# Patient Record
Sex: Female | Born: 1980 | Race: Black or African American | Hispanic: No | Marital: Single | Smoking: Former smoker
Health system: Southern US, Community
[De-identification: ages and names within clinical notes are randomized; demographics above are authoritative.]

## PROBLEM LIST (undated history)

## (undated) HISTORY — PX: TUBAL LIGATION: SHX77

---

## 2020-03-23 ENCOUNTER — Emergency Department: Payer: BC Managed Care – PPO

## 2020-03-23 ENCOUNTER — Other Ambulatory Visit: Payer: Self-pay

## 2020-03-23 ENCOUNTER — Emergency Department
Admission: EM | Admit: 2020-03-23 | Discharge: 2020-03-24 | Disposition: A | Discharge status: Home / Self Care | Payer: BC Managed Care – PPO | Attending: Emergency Medicine | Admitting: Emergency Medicine

## 2020-03-23 DIAGNOSIS — S0990XA Unspecified injury of head, initial encounter: Secondary | ICD-10-CM | POA: Insufficient documentation

## 2020-03-23 DIAGNOSIS — S61411A Laceration without foreign body of right hand, initial encounter: Secondary | ICD-10-CM | POA: Diagnosis not present

## 2020-03-23 DIAGNOSIS — R0789 Other chest pain: Secondary | ICD-10-CM | POA: Insufficient documentation

## 2020-03-23 DIAGNOSIS — F17210 Nicotine dependence, cigarettes, uncomplicated: Secondary | ICD-10-CM | POA: Insufficient documentation

## 2020-03-23 DIAGNOSIS — S52602A Unspecified fracture of lower end of left ulna, initial encounter for closed fracture: Secondary | ICD-10-CM | POA: Diagnosis not present

## 2020-03-23 DIAGNOSIS — Q7190 Unspecified reduction defect of unspecified upper limb: Secondary | ICD-10-CM

## 2020-03-23 DIAGNOSIS — S52502A Unspecified fracture of the lower end of left radius, initial encounter for closed fracture: Secondary | ICD-10-CM

## 2020-03-23 DIAGNOSIS — S6992XA Unspecified injury of left wrist, hand and finger(s), initial encounter: Secondary | ICD-10-CM | POA: Diagnosis present

## 2020-03-23 LAB — COMPREHENSIVE METABOLIC PANEL
ALT: 13 U/L (ref 0–44)
AST: 23 U/L (ref 15–41)
Albumin: 4 g/dL (ref 3.5–5.0)
Alkaline Phosphatase: 71 U/L (ref 38–126)
Anion gap: 10 (ref 5–15)
BUN: 9 mg/dL (ref 6–20)
CO2: 22 mmol/L (ref 22–32)
Calcium: 9 mg/dL (ref 8.9–10.3)
Chloride: 106 mmol/L (ref 98–111)
Creatinine, Ser: 0.66 mg/dL (ref 0.44–1.00)
GFR, Estimated: >60 mL/min (ref >60)
Glucose, Bld: 121 mg/dL — ABNORMAL HIGH (ref 70–99)
Potassium: 3 mmol/L — ABNORMAL LOW (ref 3.5–5.1)
Sodium: 138 mmol/L (ref 135–145)
Total Bilirubin: 0.6 mg/dL (ref 0.3–1.2)
Total Protein: 7.4 g/dL (ref 6.5–8.1)

## 2020-03-23 LAB — CBC WITH DIFFERENTIAL/PLATELET
Abs Immature Granulocytes: 0.07 10*3/uL (ref 0.00–0.07)
Basophils Absolute: 0 10*3/uL (ref 0.0–0.1)
Basophils Relative: 0 %
Eosinophils Absolute: 0.1 10*3/uL (ref 0.0–0.5)
Eosinophils Relative: 1 %
HCT: 31.5 % — ABNORMAL LOW (ref 36.0–46.0)
Hemoglobin: 9.9 g/dL — ABNORMAL LOW (ref 12.0–15.0)
Immature Granulocytes: 1 %
Lymphocytes Relative: 24 %
Lymphs Abs: 2.3 10*3/uL (ref 0.7–4.0)
MCH: 25.8 pg — ABNORMAL LOW (ref 26.0–34.0)
MCHC: 31.4 g/dL (ref 30.0–36.0)
MCV: 82.2 fL (ref 80.0–100.0)
Monocytes Absolute: 0.7 10*3/uL (ref 0.1–1.0)
Monocytes Relative: 8 %
Neutro Abs: 6.6 10*3/uL (ref 1.7–7.7)
Neutrophils Relative %: 66 %
Platelets: 337 10*3/uL (ref 150–400)
RBC: 3.83 MIL/uL — ABNORMAL LOW (ref 3.87–5.11)
RDW: 17.1 % — ABNORMAL HIGH (ref 11.5–15.5)
WBC: 9.9 10*3/uL (ref 4.0–10.5)
nRBC: 0 % (ref 0.0–0.2)

## 2020-03-23 LAB — TYPE AND SCREEN

## 2020-03-23 MED ORDER — SODIUM CHLORIDE 0.9 % IV BOLUS: 1000.0000 mL | Freq: Once | INTRAVENOUS | Status: DC

## 2020-03-23 MED ORDER — MORPHINE SULFATE (PF) 4 MG/ML IV SOLN
4.0000 mg | Freq: Once | INTRAVENOUS | Status: AC
  Administered 2020-03-23: 4 mg via INTRAVENOUS
  Filled 2020-03-23: qty 1

## 2020-03-23 MED ORDER — BUPIVACAINE HCL 0.5 % IJ SOLN: 50.0000 mL | Freq: Once | INTRAMUSCULAR | Status: DC

## 2020-03-23 MED ORDER — LIDOCAINE HCL 1 % IJ SOLN
10.0000 mL | Freq: Once | INTRAMUSCULAR | Status: AC
  Administered 2020-03-23: 10 mL
  Filled 2020-03-23: qty 10

## 2020-03-23 MED ORDER — BUPIVACAINE HCL (PF) 0.5 % IJ SOLN
50.0000 mL | Freq: Once | INTRAMUSCULAR | Status: DC
  Filled 2020-03-23: qty 60

## 2020-03-23 MED ORDER — LIDOCAINE HCL (PF) 1 % IJ SOLN
5.0000 mL | Freq: Once | INTRAMUSCULAR | Status: DC
  Filled 2020-03-23: qty 5

## 2020-03-23 MED ORDER — IOHEXOL 300 MG/ML  SOLN
100.0000 mL | Freq: Once | INTRAMUSCULAR | Status: AC | PRN
  Administered 2020-03-23: 100 mL via INTRAVENOUS
  Filled 2020-03-23: qty 100

## 2020-03-23 MED ORDER — PROPOFOL 10 MG/ML IV BOLUS
0.5000 mg/kg | Freq: Once | INTRAVENOUS | Status: DC
  Filled 2020-03-23: qty 20

## 2020-03-23 NOTE — ED Notes (Signed)
PA provided with lidocaine. Patient ambulatory to the restroom with assistance.

## 2020-03-23 NOTE — ED Notes (Signed)
Patient transported to CT 

## 2020-03-23 NOTE — ED Notes (Signed)
R hand laceration wrapped with non-adherent and gauze.
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
## 2020-03-23 NOTE — ED Notes (Signed)
X-ray at bedside

## 2020-03-24 ENCOUNTER — Emergency Department: Payer: BC Managed Care – PPO

## 2020-03-24 MED ORDER — MORPHINE SULFATE (PF) 4 MG/ML IV SOLN
INTRAVENOUS | Status: AC
  Administered 2020-03-24: 4 mg via INTRAVENOUS
  Filled 2020-03-24: qty 1

## 2020-03-24 MED ORDER — HYDROCODONE-ACETAMINOPHEN 5-325 MG PO TABS
2.0000 | ORAL_TABLET | Freq: Once | ORAL | Status: AC
Start: 2020-03-24 — End: 2020-03-24
  Administered 2020-03-24: 2 via ORAL
  Filled 2020-03-24: qty 2

## 2020-03-24 MED ORDER — PROPOFOL 10 MG/ML IV BOLUS
INTRAVENOUS | Status: AC | PRN
  Administered 2020-03-24 (×3): 50 mg via INTRAVENOUS

## 2020-03-24 MED ORDER — HYDROCODONE-ACETAMINOPHEN 5-325 MG PO TABS: 2.0000 | ORAL_TABLET | Freq: Four times a day (QID) | ORAL | 0 refills | Status: DC | PRN

## 2020-03-24 MED ORDER — MORPHINE SULFATE (PF) 4 MG/ML IV SOLN: 4.0000 mg | Freq: Once | INTRAVENOUS | Status: AC
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
## 2020-05-16 ENCOUNTER — Ambulatory Visit: Payer: BC Managed Care – PPO | Attending: Orthopedic Surgery | Admitting: Occupational Therapy

## 2020-05-16 ENCOUNTER — Other Ambulatory Visit: Payer: Self-pay

## 2020-05-16 ENCOUNTER — Encounter: Payer: Self-pay | Admitting: Occupational Therapy

## 2020-05-16 DIAGNOSIS — M25642 Stiffness of left hand, not elsewhere classified: Secondary | ICD-10-CM | POA: Diagnosis present

## 2020-05-16 DIAGNOSIS — M79642 Pain in left hand: Secondary | ICD-10-CM | POA: Insufficient documentation

## 2020-05-16 DIAGNOSIS — R600 Localized edema: Secondary | ICD-10-CM | POA: Insufficient documentation

## 2020-05-16 DIAGNOSIS — M6281 Muscle weakness (generalized): Secondary | ICD-10-CM | POA: Insufficient documentation

## 2020-05-16 DIAGNOSIS — M25532 Pain in left wrist: Secondary | ICD-10-CM | POA: Diagnosis present

## 2020-05-16 DIAGNOSIS — M25632 Stiffness of left wrist, not elsewhere classified: Secondary | ICD-10-CM | POA: Diagnosis present
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
## 2020-05-20 ENCOUNTER — Other Ambulatory Visit: Payer: Self-pay

## 2020-05-20 ENCOUNTER — Ambulatory Visit: Payer: BC Managed Care – PPO | Admitting: Occupational Therapy

## 2020-05-20 DIAGNOSIS — M6281 Muscle weakness (generalized): Secondary | ICD-10-CM

## 2020-05-20 DIAGNOSIS — R600 Localized edema: Secondary | ICD-10-CM | POA: Diagnosis not present

## 2020-05-20 DIAGNOSIS — M79642 Pain in left hand: Secondary | ICD-10-CM

## 2020-05-20 DIAGNOSIS — M25632 Stiffness of left wrist, not elsewhere classified: Secondary | ICD-10-CM

## 2020-05-20 DIAGNOSIS — M25642 Stiffness of left hand, not elsewhere classified: Secondary | ICD-10-CM

## 2020-05-20 DIAGNOSIS — M25532 Pain in left wrist: Secondary | ICD-10-CM
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
## 2020-05-22 ENCOUNTER — Other Ambulatory Visit: Payer: Self-pay

## 2020-05-22 ENCOUNTER — Ambulatory Visit: Payer: BC Managed Care – PPO | Admitting: Occupational Therapy

## 2020-05-22 DIAGNOSIS — M25532 Pain in left wrist: Secondary | ICD-10-CM

## 2020-05-22 DIAGNOSIS — M6281 Muscle weakness (generalized): Secondary | ICD-10-CM

## 2020-05-22 DIAGNOSIS — M79642 Pain in left hand: Secondary | ICD-10-CM

## 2020-05-22 DIAGNOSIS — R600 Localized edema: Secondary | ICD-10-CM

## 2020-05-22 DIAGNOSIS — M25632 Stiffness of left wrist, not elsewhere classified: Secondary | ICD-10-CM

## 2020-05-22 DIAGNOSIS — M25642 Stiffness of left hand, not elsewhere classified: Secondary | ICD-10-CM
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
## 2020-05-27 ENCOUNTER — Ambulatory Visit: Payer: BC Managed Care – PPO | Admitting: Occupational Therapy

## 2020-05-27 ENCOUNTER — Other Ambulatory Visit: Payer: Self-pay

## 2020-05-27 DIAGNOSIS — R600 Localized edema: Secondary | ICD-10-CM | POA: Diagnosis not present

## 2020-05-27 DIAGNOSIS — M6281 Muscle weakness (generalized): Secondary | ICD-10-CM

## 2020-05-27 DIAGNOSIS — M79642 Pain in left hand: Secondary | ICD-10-CM

## 2020-05-27 DIAGNOSIS — M25632 Stiffness of left wrist, not elsewhere classified: Secondary | ICD-10-CM

## 2020-05-27 DIAGNOSIS — M25642 Stiffness of left hand, not elsewhere classified: Secondary | ICD-10-CM

## 2020-05-27 DIAGNOSIS — M25532 Pain in left wrist: Secondary | ICD-10-CM
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
## 2020-05-29 ENCOUNTER — Other Ambulatory Visit: Payer: Self-pay

## 2020-05-29 ENCOUNTER — Ambulatory Visit: Payer: BC Managed Care – PPO | Admitting: Occupational Therapy

## 2020-05-29 DIAGNOSIS — M25642 Stiffness of left hand, not elsewhere classified: Secondary | ICD-10-CM

## 2020-05-29 DIAGNOSIS — R600 Localized edema: Secondary | ICD-10-CM | POA: Diagnosis not present

## 2020-05-29 DIAGNOSIS — M25532 Pain in left wrist: Secondary | ICD-10-CM

## 2020-05-29 DIAGNOSIS — M25632 Stiffness of left wrist, not elsewhere classified: Secondary | ICD-10-CM

## 2020-05-29 DIAGNOSIS — M6281 Muscle weakness (generalized): Secondary | ICD-10-CM

## 2020-05-29 DIAGNOSIS — M79642 Pain in left hand: Secondary | ICD-10-CM

## 2020-05-29 NOTE — Patient Instructions (Signed)
soft tissue mobs- MC and CT spreads - webspace soft tissue massage with thumb PA and RA  cont withHEP can be done 3 x day   Wean out of splint - into neoprene Benik- 2 hrs on and hard one 2 hrs on - -use hand in light activities when in [employer] with only HEP 3 x day - not work hand all the time    [employer] flexion and intrinsic fist  Composite flexion - review with pt - min A and hands on - stop pain under 2/10  Followed by AROM tendon glides -blocked 10 reps Thumb PA and RAAAROM PROM forThumb IPand MC flexion - and composite prior to opposition 10 reps Opposition to all digits- and pt to slide down each digit 10 reps    AAROM for wrist ext, flexion , RD, UD over edge of table  10reps Followed by 1 lbs weight for sup/pro on thigh -pain free - 10reps And UD and RD in standing - 10 reps  keep Benik neoprene on for these    Pain under 2/10 - slight pull or stretch
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
## 2020-06-03 ENCOUNTER — Ambulatory Visit: Payer: BC Managed Care – PPO | Admitting: Occupational Therapy

## 2020-06-03 ENCOUNTER — Other Ambulatory Visit: Payer: Self-pay

## 2020-06-03 DIAGNOSIS — M25642 Stiffness of left hand, not elsewhere classified: Secondary | ICD-10-CM

## 2020-06-03 DIAGNOSIS — M25532 Pain in left wrist: Secondary | ICD-10-CM

## 2020-06-03 DIAGNOSIS — M25632 Stiffness of left wrist, not elsewhere classified: Secondary | ICD-10-CM

## 2020-06-03 DIAGNOSIS — R600 Localized edema: Secondary | ICD-10-CM

## 2020-06-03 DIAGNOSIS — M6281 Muscle weakness (generalized): Secondary | ICD-10-CM

## 2020-06-03 DIAGNOSIS — M79642 Pain in left hand: Secondary | ICD-10-CM
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
## 2020-06-05 ENCOUNTER — Other Ambulatory Visit: Payer: Self-pay

## 2020-06-05 ENCOUNTER — Ambulatory Visit: Payer: BC Managed Care – PPO | Admitting: Occupational Therapy

## 2020-06-05 DIAGNOSIS — M25642 Stiffness of left hand, not elsewhere classified: Secondary | ICD-10-CM

## 2020-06-05 DIAGNOSIS — M25632 Stiffness of left wrist, not elsewhere classified: Secondary | ICD-10-CM

## 2020-06-05 DIAGNOSIS — R600 Localized edema: Secondary | ICD-10-CM

## 2020-06-05 DIAGNOSIS — M25532 Pain in left wrist: Secondary | ICD-10-CM

## 2020-06-05 DIAGNOSIS — M6281 Muscle weakness (generalized): Secondary | ICD-10-CM

## 2020-06-05 DIAGNOSIS — M79642 Pain in left hand: Secondary | ICD-10-CM
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
## 2020-06-10 ENCOUNTER — Other Ambulatory Visit: Payer: Self-pay

## 2020-06-10 ENCOUNTER — Ambulatory Visit: Payer: BC Managed Care – PPO | Admitting: Occupational Therapy

## 2020-06-10 DIAGNOSIS — M6281 Muscle weakness (generalized): Secondary | ICD-10-CM

## 2020-06-10 DIAGNOSIS — R600 Localized edema: Secondary | ICD-10-CM

## 2020-06-10 DIAGNOSIS — M25632 Stiffness of left wrist, not elsewhere classified: Secondary | ICD-10-CM

## 2020-06-10 DIAGNOSIS — M25532 Pain in left wrist: Secondary | ICD-10-CM

## 2020-06-10 DIAGNOSIS — M79642 Pain in left hand: Secondary | ICD-10-CM

## 2020-06-10 DIAGNOSIS — M25642 Stiffness of left hand, not elsewhere classified: Secondary | ICD-10-CM
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
## 2020-06-13 ENCOUNTER — Other Ambulatory Visit: Payer: Self-pay

## 2020-06-13 ENCOUNTER — Ambulatory Visit: Payer: BC Managed Care – PPO | Attending: Orthopedic Surgery | Admitting: Occupational Therapy

## 2020-06-13 DIAGNOSIS — M79642 Pain in left hand: Secondary | ICD-10-CM | POA: Diagnosis present

## 2020-06-13 DIAGNOSIS — M25532 Pain in left wrist: Secondary | ICD-10-CM

## 2020-06-13 DIAGNOSIS — M25642 Stiffness of left hand, not elsewhere classified: Secondary | ICD-10-CM

## 2020-06-13 DIAGNOSIS — M6281 Muscle weakness (generalized): Secondary | ICD-10-CM

## 2020-06-13 DIAGNOSIS — R600 Localized edema: Secondary | ICD-10-CM

## 2020-06-13 DIAGNOSIS — M25632 Stiffness of left wrist, not elsewhere classified: Secondary | ICD-10-CM
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
## 2020-06-17 ENCOUNTER — Ambulatory Visit: Payer: BC Managed Care – PPO | Admitting: Occupational Therapy

## 2020-06-17 ENCOUNTER — Other Ambulatory Visit: Payer: Self-pay

## 2020-06-17 DIAGNOSIS — M25532 Pain in left wrist: Secondary | ICD-10-CM

## 2020-06-17 DIAGNOSIS — M25632 Stiffness of left wrist, not elsewhere classified: Secondary | ICD-10-CM

## 2020-06-17 DIAGNOSIS — R600 Localized edema: Secondary | ICD-10-CM

## 2020-06-17 DIAGNOSIS — M25642 Stiffness of left hand, not elsewhere classified: Secondary | ICD-10-CM

## 2020-06-17 DIAGNOSIS — M6281 Muscle weakness (generalized): Secondary | ICD-10-CM

## 2020-06-17 DIAGNOSIS — M79642 Pain in left hand: Secondary | ICD-10-CM
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
## 2020-06-20 ENCOUNTER — Other Ambulatory Visit: Payer: Self-pay

## 2020-06-20 ENCOUNTER — Ambulatory Visit: Payer: BC Managed Care – PPO | Admitting: Occupational Therapy

## 2020-06-20 DIAGNOSIS — M79642 Pain in left hand: Secondary | ICD-10-CM

## 2020-06-20 DIAGNOSIS — M25632 Stiffness of left wrist, not elsewhere classified: Secondary | ICD-10-CM

## 2020-06-20 DIAGNOSIS — R600 Localized edema: Secondary | ICD-10-CM | POA: Diagnosis not present

## 2020-06-20 DIAGNOSIS — M25642 Stiffness of left hand, not elsewhere classified: Secondary | ICD-10-CM

## 2020-06-20 DIAGNOSIS — M25532 Pain in left wrist: Secondary | ICD-10-CM

## 2020-06-20 DIAGNOSIS — M6281 Muscle weakness (generalized): Secondary | ICD-10-CM
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
## 2020-06-23 ENCOUNTER — Ambulatory Visit: Payer: BC Managed Care – PPO | Admitting: Occupational Therapy

## 2020-06-23 DIAGNOSIS — R600 Localized edema: Secondary | ICD-10-CM

## 2020-06-23 DIAGNOSIS — M25532 Pain in left wrist: Secondary | ICD-10-CM

## 2020-06-23 DIAGNOSIS — M25632 Stiffness of left wrist, not elsewhere classified: Secondary | ICD-10-CM

## 2020-06-23 DIAGNOSIS — M6281 Muscle weakness (generalized): Secondary | ICD-10-CM

## 2020-06-23 DIAGNOSIS — M79642 Pain in left hand: Secondary | ICD-10-CM

## 2020-06-23 DIAGNOSIS — M25642 Stiffness of left hand, not elsewhere classified: Secondary | ICD-10-CM
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
## 2020-06-26 ENCOUNTER — Ambulatory Visit: Payer: BC Managed Care – PPO | Admitting: Occupational Therapy

## 2020-06-26 ENCOUNTER — Other Ambulatory Visit: Payer: Self-pay

## 2020-06-26 DIAGNOSIS — M6281 Muscle weakness (generalized): Secondary | ICD-10-CM

## 2020-06-26 DIAGNOSIS — M25642 Stiffness of left hand, not elsewhere classified: Secondary | ICD-10-CM

## 2020-06-26 DIAGNOSIS — R600 Localized edema: Secondary | ICD-10-CM

## 2020-06-26 DIAGNOSIS — M25632 Stiffness of left wrist, not elsewhere classified: Secondary | ICD-10-CM

## 2020-06-26 DIAGNOSIS — M79642 Pain in left hand: Secondary | ICD-10-CM

## 2020-06-26 DIAGNOSIS — M25532 Pain in left wrist: Secondary | ICD-10-CM
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
## 2020-06-30 ENCOUNTER — Ambulatory Visit: Payer: BC Managed Care – PPO | Admitting: Occupational Therapy

## 2020-06-30 ENCOUNTER — Other Ambulatory Visit: Payer: Self-pay

## 2020-06-30 DIAGNOSIS — M25642 Stiffness of left hand, not elsewhere classified: Secondary | ICD-10-CM

## 2020-06-30 DIAGNOSIS — M25632 Stiffness of left wrist, not elsewhere classified: Secondary | ICD-10-CM

## 2020-06-30 DIAGNOSIS — R600 Localized edema: Secondary | ICD-10-CM | POA: Diagnosis not present

## 2020-06-30 DIAGNOSIS — M79642 Pain in left hand: Secondary | ICD-10-CM

## 2020-06-30 DIAGNOSIS — M25532 Pain in left wrist: Secondary | ICD-10-CM

## 2020-06-30 DIAGNOSIS — M6281 Muscle weakness (generalized): Secondary | ICD-10-CM
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
## 2020-07-08 ENCOUNTER — Ambulatory Visit: Payer: BC Managed Care – PPO | Admitting: Occupational Therapy

## 2020-07-15 ENCOUNTER — Ambulatory Visit: Payer: BC Managed Care – PPO | Attending: Orthopedic Surgery | Admitting: Occupational Therapy

## 2020-07-15 DIAGNOSIS — M79642 Pain in left hand: Secondary | ICD-10-CM

## 2020-07-15 DIAGNOSIS — M6281 Muscle weakness (generalized): Secondary | ICD-10-CM | POA: Diagnosis present

## 2020-07-15 DIAGNOSIS — M25632 Stiffness of left wrist, not elsewhere classified: Secondary | ICD-10-CM | POA: Diagnosis present

## 2020-07-15 DIAGNOSIS — M25642 Stiffness of left hand, not elsewhere classified: Secondary | ICD-10-CM | POA: Diagnosis present

## 2020-07-15 DIAGNOSIS — M25532 Pain in left wrist: Secondary | ICD-10-CM | POA: Diagnosis present

## 2020-07-15 DIAGNOSIS — R600 Localized edema: Secondary | ICD-10-CM | POA: Diagnosis present
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
## 2020-07-22 ENCOUNTER — Ambulatory Visit: Payer: BC Managed Care – PPO | Admitting: Occupational Therapy

## 2020-07-22 DIAGNOSIS — M25642 Stiffness of left hand, not elsewhere classified: Secondary | ICD-10-CM

## 2020-07-22 DIAGNOSIS — M6281 Muscle weakness (generalized): Secondary | ICD-10-CM

## 2020-07-22 DIAGNOSIS — M79642 Pain in left hand: Secondary | ICD-10-CM

## 2020-07-22 DIAGNOSIS — M25532 Pain in left wrist: Secondary | ICD-10-CM

## 2020-07-22 DIAGNOSIS — M25632 Stiffness of left wrist, not elsewhere classified: Secondary | ICD-10-CM

## 2020-07-22 DIAGNOSIS — R600 Localized edema: Secondary | ICD-10-CM
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
## 2020-07-29 ENCOUNTER — Ambulatory Visit: Payer: BC Managed Care – PPO | Admitting: Occupational Therapy

## 2020-07-29 DIAGNOSIS — M25532 Pain in left wrist: Secondary | ICD-10-CM

## 2020-07-29 DIAGNOSIS — R600 Localized edema: Secondary | ICD-10-CM

## 2020-07-29 DIAGNOSIS — M79642 Pain in left hand: Secondary | ICD-10-CM

## 2020-07-29 DIAGNOSIS — M25632 Stiffness of left wrist, not elsewhere classified: Secondary | ICD-10-CM

## 2020-07-29 DIAGNOSIS — M25642 Stiffness of left hand, not elsewhere classified: Secondary | ICD-10-CM

## 2020-07-29 DIAGNOSIS — M6281 Muscle weakness (generalized): Secondary | ICD-10-CM
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
## 2020-08-04 ENCOUNTER — Other Ambulatory Visit: Payer: Self-pay | Admitting: Orthopedic Surgery

## 2020-08-04 ENCOUNTER — Other Ambulatory Visit (HOSPITAL_COMMUNITY): Payer: Self-pay | Admitting: Orthopedic Surgery

## 2020-08-04 DIAGNOSIS — M654 Radial styloid tenosynovitis [de Quervain]: Secondary | ICD-10-CM

## 2020-08-04 DIAGNOSIS — S62102D Fracture of unspecified carpal bone, left wrist, subsequent encounter for fracture with routine healing: Secondary | ICD-10-CM

## 2020-08-05 ENCOUNTER — Ambulatory Visit: Payer: BC Managed Care – PPO | Admitting: Occupational Therapy

## 2020-08-05 DIAGNOSIS — R600 Localized edema: Secondary | ICD-10-CM

## 2020-08-05 DIAGNOSIS — M25532 Pain in left wrist: Secondary | ICD-10-CM

## 2020-08-05 DIAGNOSIS — M25632 Stiffness of left wrist, not elsewhere classified: Secondary | ICD-10-CM

## 2020-08-05 DIAGNOSIS — M25642 Stiffness of left hand, not elsewhere classified: Secondary | ICD-10-CM

## 2020-08-05 DIAGNOSIS — M79642 Pain in left hand: Secondary | ICD-10-CM

## 2020-08-05 DIAGNOSIS — M6281 Muscle weakness (generalized): Secondary | ICD-10-CM
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
## 2020-08-12 ENCOUNTER — Ambulatory Visit
Admission: RE | Admit: 2020-08-12 | Discharge: 2020-08-12 | Disposition: A | Discharge status: Home / Self Care | Payer: BC Managed Care – PPO | Source: Ambulatory Visit | Attending: Orthopedic Surgery | Admitting: Orthopedic Surgery

## 2020-08-12 ENCOUNTER — Other Ambulatory Visit: Payer: Self-pay

## 2020-08-12 DIAGNOSIS — M654 Radial styloid tenosynovitis [de Quervain]: Secondary | ICD-10-CM | POA: Insufficient documentation

## 2020-08-12 DIAGNOSIS — S62102D Fracture of unspecified carpal bone, left wrist, subsequent encounter for fracture with routine healing: Secondary | ICD-10-CM | POA: Insufficient documentation

## 2020-08-18 ENCOUNTER — Ambulatory Visit: Payer: BC Managed Care – PPO | Attending: Orthopedic Surgery | Admitting: Occupational Therapy

## 2020-08-18 DIAGNOSIS — M25632 Stiffness of left wrist, not elsewhere classified: Secondary | ICD-10-CM | POA: Diagnosis present

## 2020-08-18 DIAGNOSIS — R600 Localized edema: Secondary | ICD-10-CM | POA: Insufficient documentation

## 2020-08-18 DIAGNOSIS — M6281 Muscle weakness (generalized): Secondary | ICD-10-CM | POA: Insufficient documentation

## 2020-08-18 DIAGNOSIS — M25642 Stiffness of left hand, not elsewhere classified: Secondary | ICD-10-CM | POA: Insufficient documentation

## 2020-08-18 DIAGNOSIS — M25532 Pain in left wrist: Secondary | ICD-10-CM | POA: Insufficient documentation
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
## 2020-12-17 ENCOUNTER — Other Ambulatory Visit: Payer: Self-pay | Admitting: Orthopedic Surgery

## 2020-12-18 ENCOUNTER — Other Ambulatory Visit: Payer: Self-pay | Admitting: Orthopedic Surgery

## 2020-12-25 ENCOUNTER — Encounter
Admission: RE | Admit: 2020-12-25 | Discharge: 2020-12-25 | Disposition: A | Discharge status: Home / Self Care | Payer: BC Managed Care – PPO | Source: Ambulatory Visit | Attending: Orthopedic Surgery | Admitting: Orthopedic Surgery

## 2020-12-25 ENCOUNTER — Other Ambulatory Visit: Payer: Self-pay

## 2020-12-25 NOTE — Patient Instructions (Signed)
Your procedure is scheduled on: 01/01/21 Report to DAY SURGERY DEPARTMENT LOCATED ON 2ND FLOOR MEDICAL MALL ENTRANCE. To find out your arrival time please call [phone] between 1PM - 3PM on 12/31/20.  Remember: Instructions that are not followed completely may result in serious medical risk, up to and including death, or upon the discretion of your surgeon and anesthesiologist your surgery may need to be rescheduled.     _X__ 1. Do not eat food after midnight the night before your procedure.                 No gum chewing or hard candies. You may drink clear liquids up to 2 hours                 before you are scheduled to arrive for your surgery- DO not drink clear                 liquids within 2 hours of the start of your surgery.                 Clear Liquids include:  water, apple juice without pulp, clear carbohydrate                 drink such as Clearfast or Gatorade, Black Coffee or Tea (Do not add                 anything to coffee or tea). Diabetics water only  __X__2.  On the morning of surgery brush your teeth with toothpaste and water, you                 may rinse your mouth with mouthwash if you wish.  Do not swallow any              toothpaste of mouthwash.     _X__ 3.  No Alcohol for 24 hours before or after surgery.   _X__ 4.  Do Not Smoke or use e-cigarettes For 24 Hours Prior to Your Surgery.                 Do not use any chewable tobacco products for at least 6 hours prior to                 surgery.  ____  5.  Bring all medications with you on the day of surgery if instructed.   __X__  6.  Notify your doctor if there is any change in your medical condition      (cold, fever, infections).     Do not wear jewelry, make-up, hairpins, clips or nail polish. Do not wear lotions, powders, or perfumes.  Do not shave body hair 48 hours prior to surgery. Men may shave face and neck. Do not bring valuables to the hospital.    [facility] is not responsible for any  belongings or valuables.  Contacts, dentures/partials or body piercings may not be worn into surgery. Bring a case for your contacts, glasses or hearing aids, a denture cup will be supplied. Leave your suitcase in the car. After surgery it may be brought to your room. For patients admitted to the hospital, discharge time is determined by your treatment team.   Patients discharged the day of surgery will not be allowed to drive home.   Please read over the following fact sheets that you were given:     __X__ Take these medicines the morning of surgery with A SIP OF WATER:  1. none  2.   3.   4.  5.  6.  ____ Fleet Enema (as directed)   __X__ Use CHG Soap/SAGE wipes as directed May use Dial "Gold bar" soap the night before and the morning of surgery  ____ Use inhalers on the day of surgery  ____ Stop metformin/Janumet/Farxiga 2 days prior to surgery    ____ Take 1/2 of usual insulin dose the night before surgery. No insulin the morning          of surgery.   ____ Stop Blood Thinners Coumadin/Plavix/Xarelto/Pleta/Pradaxa/Eliquis/Effient/Aspirin  on   Or contact your Surgeon, Cardiologist or Medical Doctor regarding  ability to stop your blood thinners  __X__ Stop Anti-inflammatories 7 days before surgery such as Advil, Ibuprofen, Motrin,  BC or Goodies Powder, Naprosyn, Naproxen, Aleve, Aspirin   Stop Meloxicam 7 days before surgery. May use Tylenol if needed.  __X__ Stop all herbal supplements, fish oil or vitamin E until after surgery.    ____ Bring C-Pap to the hospital.

## 2021-01-01 ENCOUNTER — Ambulatory Visit
Admission: RE | Admit: 2021-01-01 | Discharge: 2021-01-01 | Disposition: A | Discharge status: Home / Self Care | Payer: BC Managed Care – PPO | Attending: Orthopedic Surgery | Admitting: Orthopedic Surgery

## 2021-01-01 ENCOUNTER — Ambulatory Visit: Payer: BC Managed Care – PPO | Admitting: Certified Registered"

## 2021-01-01 ENCOUNTER — Other Ambulatory Visit: Payer: Self-pay

## 2021-01-01 ENCOUNTER — Encounter
Admission: RE | Disposition: A | Discharge status: Home / Self Care | Payer: Self-pay | Source: Home / Self Care | Attending: Orthopedic Surgery

## 2021-01-01 ENCOUNTER — Encounter: Payer: Self-pay | Admitting: Orthopedic Surgery

## 2021-01-01 DIAGNOSIS — Z6837 Body mass index (BMI) 37.0-37.9, adult: Secondary | ICD-10-CM | POA: Insufficient documentation

## 2021-01-01 DIAGNOSIS — E669 Obesity, unspecified: Secondary | ICD-10-CM | POA: Diagnosis not present

## 2021-01-01 DIAGNOSIS — M778 Other enthesopathies, not elsewhere classified: Secondary | ICD-10-CM | POA: Insufficient documentation

## 2021-01-01 DIAGNOSIS — M25532 Pain in left wrist: Secondary | ICD-10-CM | POA: Diagnosis present

## 2021-01-01 DIAGNOSIS — Z87891 Personal history of nicotine dependence: Secondary | ICD-10-CM | POA: Insufficient documentation

## 2021-01-01 DIAGNOSIS — M654 Radial styloid tenosynovitis [de Quervain]: Secondary | ICD-10-CM | POA: Diagnosis not present

## 2021-01-01 HISTORY — PX: WRIST OSTEOTOMY: SHX1093

## 2021-01-01 HISTORY — PX: DORSAL COMPARTMENT RELEASE: SHX5039

## 2021-01-01 LAB — POCT PREGNANCY, URINE: Preg Test, Ur: NEGATIVE

## 2021-01-01 SURGERY — RELEASE, FIRST DORSAL COMPARTMENT, HAND
Anesthesia: General | Site: Wrist | Laterality: Left

## 2021-01-01 MED ORDER — PROPOFOL 1000 MG/100ML IV EMUL
INTRAVENOUS | Status: AC
  Filled 2021-01-01: qty 100

## 2021-01-01 MED ORDER — CEFAZOLIN SODIUM-DEXTROSE 2-4 GM/100ML-% IV SOLN
2.0000 g | INTRAVENOUS | Status: AC
  Administered 2021-01-01: 12:00:00 2 g via INTRAVENOUS

## 2021-01-01 MED ORDER — FENTANYL CITRATE (PF) 100 MCG/2ML IJ SOLN
25.0000 ug | INTRAMUSCULAR | Status: DC | PRN
  Administered 2021-01-01: 13:00:00 25 ug via INTRAVENOUS

## 2021-01-01 MED ORDER — BUPIVACAINE HCL (PF) 0.5 % IJ SOLN
INTRAMUSCULAR | Status: AC
  Filled 2021-01-01: qty 30

## 2021-01-01 MED ORDER — FENTANYL CITRATE (PF) 100 MCG/2ML IJ SOLN
INTRAMUSCULAR | Status: AC
  Filled 2021-01-01: qty 2

## 2021-01-01 MED ORDER — NEOMYCIN-POLYMYXIN B GU 40-200000 IR SOLN
Status: AC
  Filled 2021-01-01: qty 2

## 2021-01-01 MED ORDER — FAMOTIDINE 20 MG PO TABS
ORAL_TABLET | ORAL | Status: AC
  Administered 2021-01-01: 10:00:00 20 mg via ORAL
  Filled 2021-01-01: qty 1

## 2021-01-01 MED ORDER — SODIUM CHLORIDE 0.9 % IR SOLN
Status: DC | PRN
  Administered 2021-01-01: 12:00:00 40 mL

## 2021-01-01 MED ORDER — ONDANSETRON HCL 4 MG/2ML IJ SOLN
INTRAMUSCULAR | Status: AC
  Filled 2021-01-01: qty 2

## 2021-01-01 MED ORDER — ONDANSETRON HCL 4 MG/2ML IJ SOLN
INTRAMUSCULAR | Status: DC | PRN
  Administered 2021-01-01: 4 mg via INTRAVENOUS

## 2021-01-01 MED ORDER — MIDAZOLAM HCL 2 MG/2ML IJ SOLN
INTRAMUSCULAR | Status: AC
  Filled 2021-01-01: qty 2

## 2021-01-01 MED ORDER — PHENYLEPHRINE HCL (PRESSORS) 10 MG/ML IV SOLN
INTRAVENOUS | Status: DC | PRN
  Administered 2021-01-01: 100 ug via INTRAVENOUS

## 2021-01-01 MED ORDER — ORAL CARE MOUTH RINSE: 15.0000 mL | Freq: Once | OROMUCOSAL | Status: AC

## 2021-01-01 MED ORDER — CHLORHEXIDINE GLUCONATE 0.12 % MT SOLN
OROMUCOSAL | Status: AC
  Administered 2021-01-01: 10:00:00 15 mL via OROMUCOSAL
  Filled 2021-01-01: qty 15

## 2021-01-01 MED ORDER — CEFAZOLIN SODIUM-DEXTROSE 2-4 GM/100ML-% IV SOLN
INTRAVENOUS | Status: AC
  Filled 2021-01-01: qty 100

## 2021-01-01 MED ORDER — CHLORHEXIDINE GLUCONATE 0.12 % MT SOLN
15.0000 mL | Freq: Once | OROMUCOSAL | Status: AC
Start: 2021-01-01 — End: 2021-01-01

## 2021-01-01 MED ORDER — FAMOTIDINE 20 MG PO TABS: 20.0000 mg | ORAL_TABLET | Freq: Once | ORAL | Status: AC

## 2021-01-01 MED ORDER — BUPIVACAINE HCL (PF) 0.5 % IJ SOLN
INTRAMUSCULAR | Status: DC | PRN
  Administered 2021-01-01: 5 mL

## 2021-01-01 MED ORDER — PROPOFOL 10 MG/ML IV BOLUS
INTRAVENOUS | Status: DC | PRN
  Administered 2021-01-01: 200 mg via INTRAVENOUS

## 2021-01-01 MED ORDER — LACTATED RINGERS IV SOLN: INTRAVENOUS | Status: DC

## 2021-01-01 MED ORDER — OXYCODONE HCL 5 MG PO TABS
5.0000 mg | ORAL_TABLET | ORAL | Status: DC | PRN
  Administered 2021-01-01: 13:00:00 5 mg via ORAL

## 2021-01-01 MED ORDER — OXYCODONE HCL 5 MG PO TABS
ORAL_TABLET | ORAL | Status: AC
  Filled 2021-01-01: qty 1

## 2021-01-01 MED ORDER — FENTANYL CITRATE (PF) 100 MCG/2ML IJ SOLN
INTRAMUSCULAR | Status: DC | PRN
  Administered 2021-01-01 (×2): 50 ug via INTRAVENOUS

## 2021-01-01 MED ORDER — HYDROCODONE-ACETAMINOPHEN 5-325 MG PO TABS: 1.0000 | ORAL_TABLET | Freq: Four times a day (QID) | ORAL | 0 refills | Status: AC | PRN

## 2021-01-01 MED ORDER — LIDOCAINE HCL (CARDIAC) PF 100 MG/5ML IV SOSY
PREFILLED_SYRINGE | INTRAVENOUS | Status: DC | PRN
  Administered 2021-01-01: 100 mg via INTRAVENOUS

## 2021-01-01 MED ORDER — PROMETHAZINE HCL 25 MG/ML IJ SOLN: 6.2500 mg | INTRAMUSCULAR | Status: DC | PRN

## 2021-01-01 MED ORDER — DEXAMETHASONE SODIUM PHOSPHATE 10 MG/ML IJ SOLN
INTRAMUSCULAR | Status: DC | PRN
  Administered 2021-01-01: 10 mg via INTRAVENOUS

## 2021-01-01 MED ORDER — DEXAMETHASONE SODIUM PHOSPHATE 10 MG/ML IJ SOLN
INTRAMUSCULAR | Status: AC
  Filled 2021-01-01: qty 1

## 2021-01-01 MED ORDER — MIDAZOLAM HCL 2 MG/2ML IJ SOLN
INTRAMUSCULAR | Status: DC | PRN
  Administered 2021-01-01: 2 mg via INTRAVENOUS

## 2021-01-01 SURGICAL SUPPLY — 31 items
APL PRP STRL LF DISP 70% ISPRP (MISCELLANEOUS) ×2
BNDG CMPR STD VLCR NS LF 5.8X3 (GAUZE/BANDAGES/DRESSINGS) ×2
BNDG ELASTIC 3X5.8 VLCR NS LF (GAUZE/BANDAGES/DRESSINGS) ×4 IMPLANT
CAST PADDING 3X4FT ST 30246 (SOFTGOODS) ×2
CHLORAPREP W/TINT 26 (MISCELLANEOUS) ×4 IMPLANT
CUFF TOURN SGL QUICK 18X4 (TOURNIQUET CUFF) IMPLANT
ELECT REM PT RETURN 9FT ADLT (ELECTROSURGICAL) ×4
ELECTRODE REM PT RTRN 9FT ADLT (ELECTROSURGICAL) ×2 IMPLANT
GAUZE 4X4 16PLY ~~LOC~~+RFID DBL (SPONGE) ×4 IMPLANT
GAUZE SPONGE 4X4 12PLY STRL (GAUZE/BANDAGES/DRESSINGS) ×4 IMPLANT
GAUZE XEROFORM 1X8 LF (GAUZE/BANDAGES/DRESSINGS) ×4 IMPLANT
GLOVE SURG SYN 9.0  PF PI (GLOVE) ×2
GLOVE SURG SYN 9.0 PF PI (GLOVE) ×2 IMPLANT
GOWN SRG 2XL LVL 4 RGLN SLV (GOWNS) ×2 IMPLANT
GOWN STRL NON-REIN 2XL LVL4 (GOWNS) ×4
GOWN STRL REUS W/ TWL LRG LVL3 (GOWN DISPOSABLE) ×2 IMPLANT
GOWN STRL REUS W/TWL LRG LVL3 (GOWN DISPOSABLE) ×4
KIT TURNOVER KIT A (KITS) ×4 IMPLANT
MANIFOLD NEPTUNE II (INSTRUMENTS) ×4 IMPLANT
NS IRRIG 500ML POUR BTL (IV SOLUTION) ×4 IMPLANT
PACK EXTREMITY ARMC (MISCELLANEOUS) ×4 IMPLANT
PAD CAST CTTN 3X4 STRL (SOFTGOODS) ×2 IMPLANT
PADDING CAST COTTON 3X4 STRL (SOFTGOODS) ×2
SCALPEL PROTECTED #15 DISP (BLADE) ×6 IMPLANT
SPLINT CAST 1 STEP 3X12 (MISCELLANEOUS) ×2 IMPLANT
SUT ETHILON 4-0 (SUTURE) ×4
SUT ETHILON 4-0 FS2 18XMFL BLK (SUTURE) ×2
SUT VIC AB 3-0 SH 27 (SUTURE) ×4
SUT VIC AB 3-0 SH 27X BRD (SUTURE) ×2 IMPLANT
SUTURE ETHLN 4-0 FS2 18XMF BLK (SUTURE) ×2 IMPLANT
WATER STERILE IRR 500ML POUR (IV SOLUTION) ×2 IMPLANT

## 2021-01-01 NOTE — Anesthesia Preprocedure Evaluation (Signed)
Anesthesia Evaluation  Patient identified by MRN, date of birth, ID band Patient awake    Reviewed: Allergy & Precautions, H&P , NPO status , Patient's Chart, lab work & pertinent test results, reviewed documented beta blocker date and time   History of Anesthesia Complications Negative for: history of anesthetic complications  Airway Mallampati: III  TM Distance: >3 FB Neck ROM: full    Dental  (+) Dental Advidsory Given   Pulmonary neg pulmonary ROS, former smoker,    Pulmonary exam normal breath sounds clear to auscultation       Cardiovascular Exercise Tolerance: Good negative cardio ROS Normal cardiovascular exam Rhythm:regular Rate:Normal     Neuro/Psych negative neurological ROS  negative psych ROS   GI/Hepatic negative GI ROS, Neg liver ROS,   Endo/Other  negative endocrine ROS  Renal/GU negative Renal ROS  negative genitourinary   Musculoskeletal   Abdominal   Peds  Hematology negative hematology ROS (+)   Anesthesia Other Findings History reviewed. No pertinent past medical history.  Obesity  Reproductive/Obstetrics negative OB ROS                             Anesthesia Physical Anesthesia Plan  ASA: 2  Anesthesia Plan: General   Post-op Pain Management:    Induction: Intravenous  PONV Risk Score and Plan: 3 and Ondansetron, Dexamethasone, Midazolam and Treatment may vary due to age or medical condition  Airway Management Planned: LMA  Additional Equipment:   Intra-op Plan:   Post-operative Plan: Extubation in OR  Informed Consent: I have reviewed the patients History and Physical, chart, labs and discussed the procedure including the risks, benefits and alternatives for the proposed anesthesia with the patient or authorized representative who has indicated his/her understanding and acceptance.     Dental Advisory Given  Plan Discussed with:  Anesthesiologist, CRNA and Surgeon  Anesthesia Plan Comments:         Anesthesia Quick Evaluation

## 2021-01-01 NOTE — Transfer of Care (Signed)
Immediate Anesthesia Transfer of Care Note  Patient: Regina Moore  Procedure(s) Performed: Left DeQuervain's release (Left) Left distal radius osteotomy (Left: Wrist)  Patient Location: PACU  Anesthesia Type:General  Level of Consciousness: awake, alert  and oriented  Airway & Oxygen Therapy: Patient Spontanous Breathing and Patient connected to nasal cannula oxygen  Post-op Assessment: Report given to RN, Post -op Vital signs reviewed and stable and Patient moving all extremities  Post vital signs: Reviewed and stable  Last Vitals:  Vitals Value Taken Time  BP 148/84 01/01/21 1235  Temp    Pulse 82 01/01/21 1235  Resp 13 01/01/21 1235  SpO2 100 % 01/01/21 1235    Last Pain:  Vitals:   01/01/21 0932  TempSrc: Oral  PainSc: 0-No pain         Complications: No notable events documented.

## 2021-01-01 NOTE — Anesthesia Procedure Notes (Signed)
Procedure Name: LMA Insertion Date/Time: 01/01/2021 12:04 PM Performed by: Katherine Basset, CRNA Pre-anesthesia Checklist: Patient identified, Emergency Drugs available, Suction available and Patient being monitored Patient Re-evaluated:Patient Re-evaluated prior to induction Oxygen Delivery Method: Circle system utilized Preoxygenation: Pre-oxygenation with 100% oxygen Induction Type: IV induction Ventilation: Mask ventilation without difficulty LMA: LMA inserted LMA Size: 4.0 Tube type: Oral Number of attempts: 1 Placement Confirmation: positive ETCO2 and breath sounds checked- equal and bilateral Tube secured with: Tape Dental Injury: Teeth and Oropharynx as per pre-operative assessment
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
## 2021-01-01 NOTE — Discharge Instructions (Addendum)
Keep arm elevated today and tomorrow. Loosen Ace wrap if it feels like it is too tight later today or tomorrow. Remove entire bandage on Saturday and cover incision with Band-Aid, keep hand and incision clean and dry. Pain medicine as directed.  AMBULATORY SURGERY  DISCHARGE INSTRUCTIONS   The drugs that you were given will stay in your system until tomorrow so for the next 24 hours you should not:  Drive an automobile Make any legal decisions Drink any alcoholic beverage   You may resume regular meals tomorrow.  Today it is better to start with liquids and gradually work up to solid foods.  You may eat anything you prefer, but it is better to start with liquids, then soup and crackers, and gradually work up to solid foods.   Please notify your doctor immediately if you have any unusual bleeding, trouble breathing, redness and pain at the surgery site, drainage, fever, or pain not relieved by medication.    Additional Instructions:    Please contact your physician with any problems or Same Day Surgery at [phone], Monday through Friday 6 am to 4 pm, or [place] [facility] [employer] number at [phone].

## 2021-01-01 NOTE — Op Note (Signed)
01/01/2021  12:38 PM  PATIENT:  Regina Moore  39 y.o. 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  M65.4 Bony spur distal radius  PROCEDURE:  Procedure(s): Left DeQuervain's release (Left) Left distal radius osteotomy (Left)  SURGEON: Leitha Schuller, MD  ASSISTANTS: None  ANESTHESIA:   general  EBL:  Total I/O In: 100 [IV Piggyback:100] Out: 0   BLOOD ADMINISTERED:none  DRAINS: none   LOCAL MEDICATIONS USED:  MARCAINE     SPECIMEN:  No Specimen  DISPOSITION OF SPECIMEN:  N/A  COUNTS:  YES  TOURNIQUET:   Total Tourniquet Time Documented: Upper Arm (Left) - 5 minutes Total: Upper Arm (Left) - 5 minutes   IMPLANTS: None  DICTATION: .Dragon Dictation patient was brought to the operating room and after adequate anesthesia was obtained the left arm was prepped and draped in the usual sterile fashion with a tourniquet applied the upper arm.  After patient identification and timeout procedure tourniquet was raised to 50 mmHg.  Incision was made centered over the radial styloid approximately a centimeter incision made with care being taken to preserve subcutaneous nerves.  The Decore veins release was carried out with exposure incision and identifying the tendons and seeing that there is no compression proximal or distal but the tendons were stable at the close with range of motion.  Next going more radially into the volar surface the bony spur from prior fracture could be palpated and was visualized and removed with use of a rondure.  By palpation when it was smoothed  the wound was irrigated and tourniquet let down with minimal bleeding present.  The wound was closed with simple erupted 4-0 nylon after infiltration of 5 cc half percent Sensorcaine without epinephrine.  A sterile dressing of Xeroform 4 x 4 web roll and Ace wrap applied.  PLAN OF CARE: Discharge to home after PACU  PATIENT DISPOSITION:

## 2021-01-02 ENCOUNTER — Encounter: Payer: Self-pay | Admitting: Orthopedic Surgery

## 2021-01-02 NOTE — Anesthesia Postprocedure Evaluation (Signed)
Anesthesia Post Note  Patient: Regina Moore  Procedure(s) Performed: Left DeQuervain's release (Left) Left distal radius osteotomy (Left: Wrist)  Patient location during evaluation: PACU Anesthesia Type: General Level of consciousness: awake and alert Pain management: pain level controlled Vital Signs Assessment: post-procedure vital signs reviewed and stable Respiratory status: spontaneous breathing, nonlabored ventilation, respiratory function stable and patient connected to nasal cannula oxygen Cardiovascular status: blood pressure returned to baseline and stable Postop Assessment: no apparent nausea or vomiting Anesthetic complications: no   No notable events documented.   Last Vitals:  Vitals:   01/01/21 1245 01/01/21 1324  BP:  (!) 141/89  Pulse: 83 86  Resp: 11 14  Temp:  (!) 36.2 C  SpO2: 99% 98%    Last Pain:  Vitals:   01/01/21 1324  TempSrc: Temporal  PainSc: 4                  Lenard Simmer

## 2022-12-29 ENCOUNTER — Emergency Department: Payer: BC Managed Care – PPO

## 2022-12-29 ENCOUNTER — Emergency Department
Admission: EM | Admit: 2022-12-29 | Discharge: 2022-12-29 | Disposition: A | Discharge status: Home / Self Care | Payer: BC Managed Care – PPO | Attending: Emergency Medicine | Admitting: Emergency Medicine

## 2022-12-29 DIAGNOSIS — Y92410 Unspecified street and highway as the place of occurrence of the external cause: Secondary | ICD-10-CM | POA: Diagnosis not present

## 2022-12-29 DIAGNOSIS — S60312A Abrasion of left thumb, initial encounter: Secondary | ICD-10-CM | POA: Insufficient documentation

## 2022-12-29 DIAGNOSIS — S63502A Unspecified sprain of left wrist, initial encounter: Secondary | ICD-10-CM | POA: Insufficient documentation

## 2022-12-29 DIAGNOSIS — S6992XA Unspecified injury of left wrist, hand and finger(s), initial encounter: Secondary | ICD-10-CM | POA: Diagnosis present

## 2022-12-29 MED ORDER — MELOXICAM 15 MG PO TABS: 15.0000 mg | ORAL_TABLET | Freq: Every day | ORAL | 0 refills | Status: AC
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
## 2022-12-29 NOTE — Discharge Instructions (Signed)
Please continue with thumb spica brace and call orthopedic office tomorrow to schedule follow-up appointment for repeat evaluation.  You are tender over an area called the anatomical snuffbox which is concerning for possible scaphoid fracture although your x-rays are negative we would like to treat this as a possible fracture.  Please follow-up for repeat evaluation and 7 to 10 days.  Wear splint at all times.  You may take Tylenol and meloxicam as needed for pain
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
## 2023-01-13 ENCOUNTER — Ambulatory Visit: Payer: BC Managed Care – PPO

## 2023-01-27 ENCOUNTER — Ambulatory Visit: Payer: BC Managed Care – PPO

## 2023-02-22 ENCOUNTER — Other Ambulatory Visit: Payer: Self-pay | Admitting: Physician Assistant

## 2023-02-22 DIAGNOSIS — M25532 Pain in left wrist: Secondary | ICD-10-CM
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
